# Patient Record
Sex: Male | Born: 1995 | Race: Black or African American | Hispanic: No | Marital: Single | Smoking: Never smoker
Health system: Southern US, Community
[De-identification: ages and names within clinical notes are randomized; demographics above are authoritative.]

## PROBLEM LIST (undated history)

## (undated) HISTORY — PX: TONSILLECTOMY: SUR1361

---
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
## 2016-08-27 ENCOUNTER — Telehealth: Payer: Self-pay | Admitting: *Deleted

## 2016-08-27 NOTE — Telephone Encounter (Signed)
Pt rescheduled new pt appointment.

## 2016-08-28 ENCOUNTER — Ambulatory Visit: Payer: Self-pay | Admitting: Family Medicine

## 2016-09-09 ENCOUNTER — Telehealth: Payer: Self-pay | Admitting: Behavioral Health

## 2016-09-09 NOTE — Telephone Encounter (Signed)
Patient cancelled the appointment for tomorrow and will call the office at a later date to reschedule.

## 2016-09-10 ENCOUNTER — Ambulatory Visit: Payer: Self-pay | Admitting: Family Medicine
